# Patient Record
Sex: Female | Born: 1975 | Race: Black or African American | Hispanic: No | Smoking: Current every day smoker
Health system: Southern US, Community
[De-identification: ages and names within clinical notes are randomized; demographics above are authoritative.]

## PROBLEM LIST (undated history)

## (undated) DIAGNOSIS — I10 Essential (primary) hypertension: Secondary | ICD-10-CM

## (undated) DIAGNOSIS — E7800 Pure hypercholesterolemia, unspecified: Secondary | ICD-10-CM

## (undated) DIAGNOSIS — E119 Type 2 diabetes mellitus without complications: Secondary | ICD-10-CM

---

## 2018-02-18 ENCOUNTER — Encounter (HOSPITAL_COMMUNITY): Payer: Self-pay | Admitting: *Deleted

## 2018-02-18 ENCOUNTER — Emergency Department (HOSPITAL_COMMUNITY): Payer: Self-pay

## 2018-02-18 ENCOUNTER — Emergency Department (HOSPITAL_COMMUNITY)
Admission: EM | Admit: 2018-02-18 | Discharge: 2018-02-18 | Disposition: A | Discharge status: Home / Self Care | Payer: Self-pay | Attending: Emergency Medicine | Admitting: Emergency Medicine

## 2018-02-18 ENCOUNTER — Other Ambulatory Visit: Payer: Self-pay

## 2018-02-18 DIAGNOSIS — F17210 Nicotine dependence, cigarettes, uncomplicated: Secondary | ICD-10-CM | POA: Insufficient documentation

## 2018-02-18 DIAGNOSIS — Y929 Unspecified place or not applicable: Secondary | ICD-10-CM | POA: Insufficient documentation

## 2018-02-18 DIAGNOSIS — E119 Type 2 diabetes mellitus without complications: Secondary | ICD-10-CM | POA: Insufficient documentation

## 2018-02-18 DIAGNOSIS — Y999 Unspecified external cause status: Secondary | ICD-10-CM | POA: Insufficient documentation

## 2018-02-18 DIAGNOSIS — S62525A Nondisplaced fracture of distal phalanx of left thumb, initial encounter for closed fracture: Secondary | ICD-10-CM | POA: Insufficient documentation

## 2018-02-18 DIAGNOSIS — I10 Essential (primary) hypertension: Secondary | ICD-10-CM | POA: Insufficient documentation

## 2018-02-18 DIAGNOSIS — Y9367 Activity, basketball: Secondary | ICD-10-CM | POA: Insufficient documentation

## 2018-02-18 DIAGNOSIS — X509XXA Other and unspecified overexertion or strenuous movements or postures, initial encounter: Secondary | ICD-10-CM | POA: Insufficient documentation

## 2018-02-18 HISTORY — DX: Type 2 diabetes mellitus without complications: E11.9

## 2018-02-18 HISTORY — DX: Pure hypercholesterolemia, unspecified: E78.00

## 2018-02-18 HISTORY — DX: Essential (primary) hypertension: I10

## 2018-02-18 MED ORDER — TRAMADOL HCL 50 MG PO TABS: 50.0000 mg | ORAL_TABLET | Freq: Every evening | ORAL | 0 refills | Status: AC | PRN

## 2018-02-18 NOTE — ED Triage Notes (Signed)
Pt reports injuring left thumb on Monday. Having pain and swelling since.

## 2018-02-18 NOTE — Discharge Instructions (Addendum)

## 2018-02-18 NOTE — ED Provider Notes (Signed)
[facility]   CSN: [id number] Arrival date & time: 02/18/18  [id number]     History   Chief Complaint Chief Complaint  Patient presents with  . Finger Injury    HPI Jamelle HaringJoyce Doughty is a 42 y.o. female presents today for evaluation of left thumb pain.  She reports that she was playing basketball with family on Monday when she thought she jammed her thumb.  She reports that she has been having pain and swelling to the left thumb since.  She reports no numbness or tingling.  She reports that she does smoke.  Reports that this is an isolated injury.  HPI  Past Medical History:  Diagnosis Date  . Diabetes mellitus without complication (HCC)   . High cholesterol   . Hypertension     There are no active problems to display for this patient.   History reviewed. No pertinent surgical history.  OB History   None      Home Medications    Prior to Admission medications   Medication Sig Start Date End Date Taking? Authorizing Provider  traMADol (ULTRAM) 50 MG tablet Take 1 tablet (50 mg total) by mouth at bedtime as needed. 02/18/18   Cristina GongHammond, [place] Sun W, PA-C    Family History History reviewed. No pertinent family history.  Social History Social History   Tobacco Use  . Smoking status: Current Every Day Smoker  Substance Use Topics  . Alcohol use: Yes    Comment: occ  . Drug use: Never     Allergies   Penicillins and Reglan [metoclopramide]   Review of Systems Review of Systems  Constitutional: Negative for chills and fever.  Musculoskeletal:       Pain in left thumb.   Skin: Positive for color change (Brusing over nail). Negative for wound.  Neurological: Negative for weakness and numbness.  Psychiatric/Behavioral: Negative for confusion.     Physical Exam Updated Vital Signs BP 109/86 (BP Location: Right Arm)   Pulse 92   Temp 98 F (36.7 C) (Oral)   Resp 18   SpO2 100%   Physical Exam    Constitutional: She appears well-developed and well-nourished.  HENT:  Head: Normocephalic and atraumatic.  Cardiovascular:  Left hand has 2+ radial pulse, brisk capillary refill.  Musculoskeletal:  There is tenderness to palpation over the distal left thumb.  There is mild edema over the left thumb.    Neurological:  Sensation intact to left hand and thumb.  Skin: Skin is warm and dry. She is not diaphoretic.  Proximally there is a very small, under 25%, area of subungual bruising.  Nail is firmly intact.   Nursing note and vitals reviewed.    ED Treatments / Results  Labs (all labs ordered are listed, but only abnormal results are displayed) Labs Reviewed - No data to display  EKG  EKG Interpretation None       Radiology Dg Finger Thumb Left  Result Date: 02/18/2018 CLINICAL DATA:  Basketball injury several days ago with persistent thumb pain, initial encounter EXAM: LEFT THUMB 3V COMPARISON:  None. FINDINGS: Oblique fracture is noted in the first distal phalanx which appears to extend to the interphalangeal joint on the oblique image. Associated soft tissue swelling is noted. No other focal abnormality is noted. IMPRESSION: First distal phalangeal fracture Electronically Signed   By: Alcide CleverMark  Lukens M.D.   On: 02/18/2018 09:49    Procedures Procedures (including critical care time)  Medications Ordered in ED  Medications - No data to display   Initial Impression / Assessment and Plan / ED Course  I have reviewed the triage vital signs and the nursing notes.  Pertinent labs & imaging results that were available during my care of the patient were reviewed by me and considered in my medical decision making (see chart for details).    Clois Montavon presents today for evaluation of left thumb pain after playing basketball 4 days ago.  X-rays were obtained showing an oblique fracture of the first distal phalanx which appears to extend into the interphalangeal joint.  She was  given a finger splint and hand surgery follow-up.  She was given a short course of tramadol which she has previously tolerated well.  Discharged home.   Final Clinical Impressions(s) / ED Diagnoses   Final diagnoses:  Closed nondisplaced fracture of distal phalanx of left thumb, initial encounter    ED Discharge Orders        Ordered    traMADol (ULTRAM) 50 MG tablet  At bedtime PRN     02/18/18 1118       Cristina Gong, [place] 02/18/18 1708    Shaune Pollack, MD 02/19/18 [phone]

## 2018-02-18 NOTE — ED Notes (Signed)
Refused DC vitals.

## 2019-04-17 IMAGING — CR DG FINGER THUMB 2+V*L*
3 series · 3 of 3 positions shown · non-contrast
Comparison: None.

CLINICAL DATA: Basketball injury several days ago with persistent
thumb pain, initial encounter

EXAM:
LEFT THUMB 3V

[finger ap]
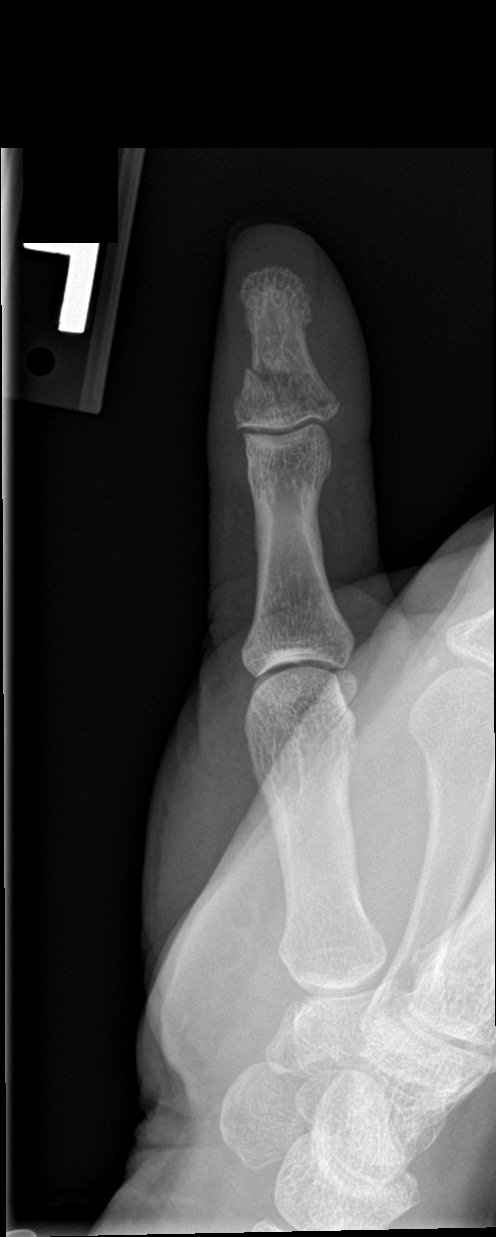

[finger obl]
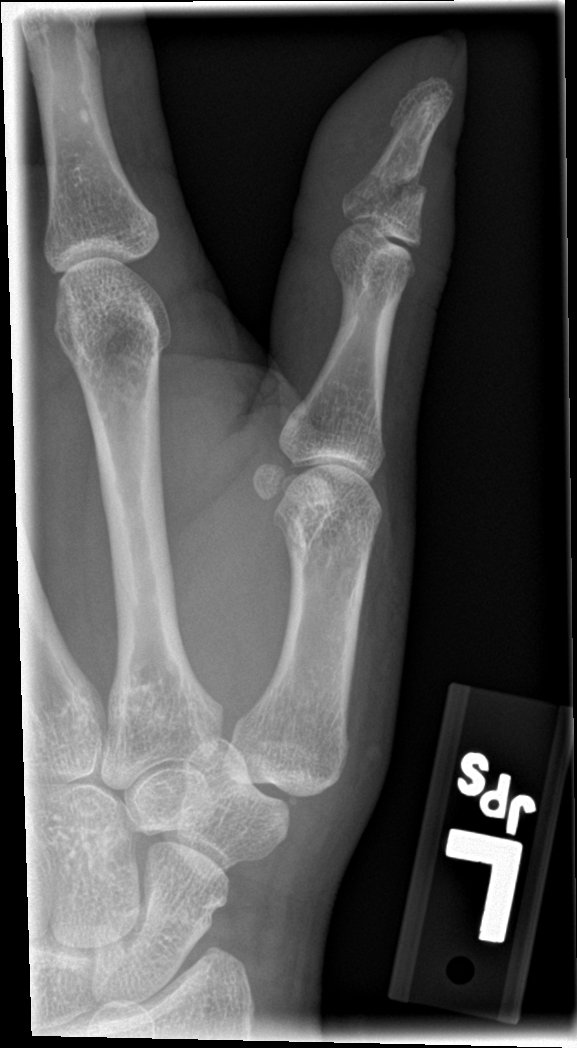

[finger lat]
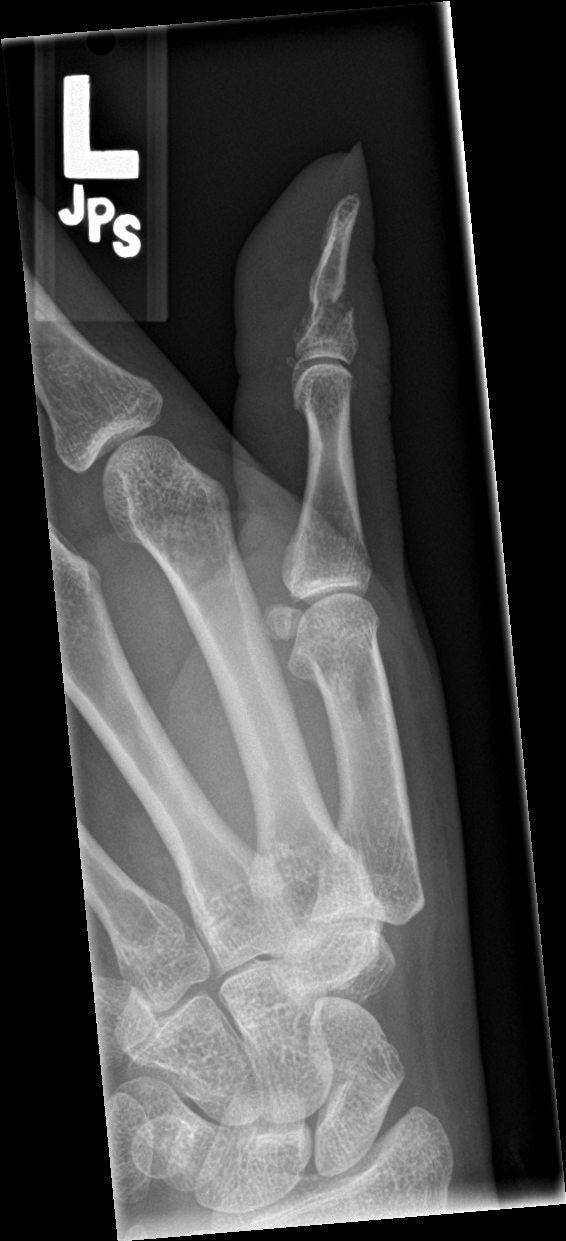

[3 of 3 positions shown; findings below may reference images not displayed]

FINDINGS: Oblique fracture is noted in the first distal phalanx which appears
to extend to the interphalangeal joint on the oblique image.
Associated soft tissue swelling is noted. No other focal abnormality
is noted.
IMPRESSION: First distal phalangeal fracture
